# Patient Record
Sex: Female | Born: 1960 | Race: White | Hispanic: No | Marital: Married
Health system: Southern US, Community
[De-identification: ages and names within clinical notes are randomized; demographics above are authoritative.]

---

## 2005-03-29 ENCOUNTER — Ambulatory Visit (HOSPITAL_COMMUNITY): Admission: RE | Admit: 2005-03-29 | Discharge: 2005-03-29 | Payer: Self-pay | Admitting: Family Medicine

## 2005-12-01 ENCOUNTER — Other Ambulatory Visit: Admission: RE | Admit: 2005-12-01 | Discharge: 2005-12-01 | Payer: Self-pay | Admitting: Family Medicine

## 2006-03-30 ENCOUNTER — Ambulatory Visit (HOSPITAL_COMMUNITY): Admission: RE | Admit: 2006-03-30 | Discharge: 2006-03-30 | Payer: Self-pay | Admitting: Family Medicine

## 2007-01-26 ENCOUNTER — Other Ambulatory Visit: Admission: RE | Admit: 2007-01-26 | Discharge: 2007-01-26 | Payer: Self-pay | Admitting: Family Medicine

## 2007-04-05 ENCOUNTER — Ambulatory Visit (HOSPITAL_COMMUNITY): Admission: RE | Admit: 2007-04-05 | Discharge: 2007-04-05 | Payer: Self-pay | Admitting: Family Medicine

## 2008-03-26 ENCOUNTER — Other Ambulatory Visit: Admission: RE | Admit: 2008-03-26 | Discharge: 2008-03-26 | Payer: Self-pay | Admitting: Family Medicine

## 2008-04-07 ENCOUNTER — Ambulatory Visit (HOSPITAL_COMMUNITY): Admission: RE | Admit: 2008-04-07 | Discharge: 2008-04-07 | Payer: Self-pay | Admitting: Family Medicine

## 2009-04-20 ENCOUNTER — Ambulatory Visit (HOSPITAL_COMMUNITY): Admission: RE | Admit: 2009-04-20 | Discharge: 2009-04-20 | Payer: Self-pay | Admitting: Family Medicine

## 2009-05-08 ENCOUNTER — Other Ambulatory Visit: Admission: RE | Admit: 2009-05-08 | Discharge: 2009-05-08 | Payer: Self-pay | Admitting: Family Medicine

## 2010-02-05 ENCOUNTER — Encounter: Admission: RE | Admit: 2010-02-05 | Discharge: 2010-02-05 | Payer: Self-pay | Admitting: Family Medicine

## 2010-02-16 ENCOUNTER — Encounter
Admission: RE | Admit: 2010-02-16 | Discharge: 2010-02-16 | Payer: Self-pay | Source: Home / Self Care | Attending: Family Medicine | Admitting: Family Medicine

## 2010-02-16 ENCOUNTER — Other Ambulatory Visit
Admission: RE | Admit: 2010-02-16 | Discharge: 2010-02-16 | Payer: Self-pay | Source: Home / Self Care | Admitting: Interventional Radiology

## 2010-03-26 ENCOUNTER — Other Ambulatory Visit (HOSPITAL_COMMUNITY): Payer: Self-pay | Admitting: Family Medicine

## 2010-03-26 DIAGNOSIS — Z0000 Encounter for general adult medical examination without abnormal findings: Secondary | ICD-10-CM

## 2010-03-26 DIAGNOSIS — Z1231 Encounter for screening mammogram for malignant neoplasm of breast: Secondary | ICD-10-CM

## 2010-04-06 LAB — COMPREHENSIVE METABOLIC PANEL
AST: 22 U/L (ref 0–37)
Albumin: 3.6 g/dL (ref 3.5–5.2)
BUN: 10 mg/dL (ref 6–23)
Calcium: 9.2 mg/dL (ref 8.4–10.5)
Chloride: 106 mEq/L (ref 96–112)
Creatinine, Ser: 0.92 mg/dL (ref 0.4–1.2)
Total Bilirubin: 0.6 mg/dL (ref 0.3–1.2)
Total Protein: 6.3 g/dL (ref 6.0–8.3)

## 2010-04-06 LAB — CBC
HCT: 42.2 % (ref 36.0–46.0)
Hemoglobin: 14 g/dL (ref 12.0–15.0)
MCH: 29.6 pg (ref 26.0–34.0)
MCV: 89.2 fL (ref 78.0–100.0)
RBC: 4.73 MIL/uL (ref 3.87–5.11)
WBC: 7.8 10*3/uL (ref 4.0–10.5)

## 2010-04-06 LAB — SURGICAL PCR SCREEN

## 2010-04-15 ENCOUNTER — Ambulatory Visit (HOSPITAL_COMMUNITY)
Admission: RE | Admit: 2010-04-15 | Discharge: 2010-04-15 | Disposition: A | Discharge status: Home / Self Care | Payer: BC Managed Care – PPO | Source: Ambulatory Visit | Attending: Surgery | Admitting: Surgery

## 2010-04-15 ENCOUNTER — Other Ambulatory Visit: Payer: Self-pay | Admitting: Surgery

## 2010-04-15 DIAGNOSIS — D34 Benign neoplasm of thyroid gland: Secondary | ICD-10-CM | POA: Insufficient documentation

## 2010-04-19 NOTE — Op Note (Signed)
NAMESALIMAH, Andersen NO.:  [id number]  MEDICAL RECORD NO.:  [id number]           PATIENT TYPE:  O  LOCATION:  DAYL                         FACILITY:  [facility]  PHYSICIAN:  Ardeth Sportsman, MD     DATE OF BIRTH:  06-Apr-1960  DATE OF PROCEDURE:  04/15/2010 DATE OF DISCHARGE:                              OPERATIVE REPORT   PRIMARY CARE PHYSICIAN:  Brandy Andersen, M.D.  SURGEON:  Ardeth Sportsman, M.D.  ASSISTANT:  Abigail Miyamoto, M.D.  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  PROCEDURE PERFORMED:  Left thyroid lobectomy and isthmusectomy.  ANESTHESIA: 1. General anesthesia. 2. Local anesthetic in a field block.  SPECIMENS:  Left thyroid lobe and isthmus.  A stitch marks the left superior pole.  DRAINS:  None.  ESTIMATED BLOOD LOSS:  Less than 30 mL.  COMPLICATIONS:  None.  MAJOR INDICATIONS:  [name] is a 50 year old female, found to have an abnormal lesion in her neck.  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  She was sent to me for surgical evaluation.  The anatomy and physiology of parathyroid and thyroid function was discussed.  Given the fact this is an abnormal lesion, an excisional biopsy was recommended.  Because I did not have a definite confirmation of cancer, I thought a lobectomy and isthmusectomy would be warranted. The possibility of needing a total thyroidectomy depending on the interim findings was discussed.  The risks of nerve injury, injury to contiguous structures, and other risks, benefits, and alternatives were discussed.  Questions answered and she agreed to proceed.  OPERATIVE FINDINGS:  She had some maybe mild  thyroiditis and thickening of her thyroid gland, but it was of normal size.  In her medial left inferior pole, she had an obvious lesion in her thyroid gland, 2 cm, smooth and hard, but no contiguous invasion.  I did not sense any significant lymphadenopathy.  DESCRIPTION OF PROCEDURE:  Informed consent was confirmed.  The patient received IV cefazolin prior to incision.  She had sequential compression devices active during the entire case.  She was positioned supine with both arms tucked.  She had a gentle shoulder roll that helped elevate and put her neck more in a sniffing position under careful positioning with Anesthesia.  Her head, neck, and chest were prepped and draped in a sterile fashion.  Surgical time-out confirmed the plan.  I made a curvilinear collar incision 2 fingerbreadths above the clavicles, as she had a rather long neck, just inferior to where the obvious mass could be seen and felt.  I started around 5 cm and extend the incision to 6 cm.  I went through the subcutaneous tissues and platysma muscle layers transversely.  I elevated subplatysmal plane superiorly and inferiorly and used a Mahorner retractor to help expose the area.  I went between the strap muscles vertically through the midline longitudinal incision, began  to bluntly free off the left strap muscles off the left anterior thyroid lobe.  I switched off cautery and started using focused blunt dissection as well as Harmonic scalpel.  I initially mobilized laterally and could easily find the middle thyroid vein.  I isolated and ligated that with clips.  The inferior lobe was a little more obvious and the superior pole seemed to be going a little more elevated, so I decided to mobilize the inferior pole first.  I carefully freed off feeding vessels to the inferior pole and ligated them with clips and ultrasonic dissection.  The inferior parathyroid gland was seen on the inferior pole.  Eventually, I  mobilized that off and sent the inferior thyroid vessels and freed that off.  I was able to mobilize up towards the anterior trachea in the inferior pole.  I turned attention to the superior pole.  I freed the strap muscles off the anterolateral superior pole and came more proximally until I tapered off to the more proximal distal point.  I did bluntly free off the superior pole off the tracheal musculature as well.  With that, I came down and I could isolate the feeding vessels and ligated them with clips, staying very close onto the pole itself.  I saw the left superior parathyroid gland and the posterior superior pole and mobilized that off as well, taking care to avoid any injury and not manipulating it.  After that, I eventually could mobilize her well and I could see the left laryngeal nerve and was staying posteriorly and could follow its course. I stayed anterior to it.  Ligament of Allyson Sabal was a little more pronounced, but I carefully skeletonized that and ligate that off the laryngeal-tracheal junction using ultrasonic dissection, seeing the nerve and staying away from it.  With that final mobilization of ligament of Berry, I had excellent mobilization.  I freed the isthmus off the trachea bluntly and with focused cautery and came over it actually towards the left medial thyroid gland, staying on the anterior trachea and freed that of.  We inspected and saw good hemostasis.  There was a little oozing off the thyroid gland itself.  I did clamp the thyroid gland at the junction between the isthmus and the medial left thyroid lobe with a Kelly clamp. I freed the gland off using ultrasonic dissection and placed a 2-0 silk suture ligature on the medial thyroid lobe for marking and additional hemostasis.  I marked the thyroid lobe and sent that off.  We did copious irrigation in the wound.  Hemostasis was excellent.  I placed some fibrillar hemostatic agent in the left  tracheoesophageal groove wound and it laid well.  I reapproximated the strap muscles using 3-0 Vicryl interrupted stitches vertically and the platysmal muscles using 3-0 Vicryl interrupted stitches transversely.  The skin was closed using 4-0 Monocryl running subcuticular stitch with tails out and Steri- Strips.  The patient was extubated and sent to recovery room in stable condition.  I discussed postoperative care with the patient in my office and just prior in the holding area.  I am about to discuss it her family as well.     Ardeth Sportsman, MD     SCG/MEDQ  D:  04/15/2010  T:  04/16/2010  Job:  [id number]  cc:   Otilio Connors. Gerri Andersen, M.D. Fax: [phone]  Electronically Signed by Karie Soda MD on 04/19/2010 02:23:48 PM

## 2010-05-06 ENCOUNTER — Ambulatory Visit (HOSPITAL_COMMUNITY): Payer: Self-pay

## 2010-05-20 ENCOUNTER — Ambulatory Visit (HOSPITAL_COMMUNITY)
Admission: RE | Admit: 2010-05-20 | Discharge: 2010-05-20 | Disposition: A | Discharge status: Home / Self Care | Payer: BC Managed Care – PPO | Source: Ambulatory Visit | Attending: Family Medicine | Admitting: Family Medicine

## 2010-05-20 DIAGNOSIS — Z1231 Encounter for screening mammogram for malignant neoplasm of breast: Secondary | ICD-10-CM | POA: Insufficient documentation

## 2010-08-12 ENCOUNTER — Other Ambulatory Visit: Payer: Self-pay | Admitting: Gastroenterology

## 2010-09-06 ENCOUNTER — Ambulatory Visit (HOSPITAL_COMMUNITY)
Admission: AD | Admit: 2010-09-06 | Payer: BC Managed Care – PPO | Source: Ambulatory Visit | Admitting: Obstetrics and Gynecology

## 2010-11-19 ENCOUNTER — Telehealth (INDEPENDENT_AMBULATORY_CARE_PROVIDER_SITE_OTHER): Payer: Self-pay | Admitting: General Surgery

## 2010-11-19 NOTE — Telephone Encounter (Signed)
Patient left voicemail stating that she is trying to get a life insurance policy and they are wanting to raise her rates due to the spec of thyroid tissue left on the the right side of her thyroid. They are saying this could be precancerous. She may need Dr Michaell Cowing to dictate a letter to the life insurance company. Please call her.

## 2011-04-25 ENCOUNTER — Other Ambulatory Visit (HOSPITAL_COMMUNITY): Payer: Self-pay | Admitting: Family Medicine

## 2011-04-25 DIAGNOSIS — Z1231 Encounter for screening mammogram for malignant neoplasm of breast: Secondary | ICD-10-CM

## 2011-05-23 ENCOUNTER — Ambulatory Visit (HOSPITAL_COMMUNITY)
Admission: RE | Admit: 2011-05-23 | Discharge: 2011-05-23 | Disposition: A | Discharge status: Home / Self Care | Payer: BC Managed Care – PPO | Source: Ambulatory Visit | Attending: Family Medicine | Admitting: Family Medicine

## 2011-05-23 DIAGNOSIS — Z1231 Encounter for screening mammogram for malignant neoplasm of breast: Secondary | ICD-10-CM | POA: Insufficient documentation

## 2012-03-28 ENCOUNTER — Other Ambulatory Visit: Payer: Self-pay

## 2012-04-27 ENCOUNTER — Other Ambulatory Visit (HOSPITAL_COMMUNITY): Payer: Self-pay | Admitting: Family Medicine

## 2012-04-27 DIAGNOSIS — Z1231 Encounter for screening mammogram for malignant neoplasm of breast: Secondary | ICD-10-CM

## 2012-05-23 ENCOUNTER — Ambulatory Visit (HOSPITAL_COMMUNITY)
Admission: RE | Admit: 2012-05-23 | Discharge: 2012-05-23 | Disposition: A | Discharge status: Home / Self Care | Payer: BC Managed Care – PPO | Source: Ambulatory Visit | Attending: Family Medicine | Admitting: Family Medicine

## 2012-05-23 DIAGNOSIS — Z1231 Encounter for screening mammogram for malignant neoplasm of breast: Secondary | ICD-10-CM | POA: Insufficient documentation

## 2012-11-13 ENCOUNTER — Other Ambulatory Visit: Payer: Self-pay | Admitting: Family Medicine

## 2012-11-13 ENCOUNTER — Other Ambulatory Visit (HOSPITAL_COMMUNITY)
Admission: RE | Admit: 2012-11-13 | Discharge: 2012-11-13 | Disposition: A | Discharge status: Home / Self Care | Payer: BC Managed Care – PPO | Source: Ambulatory Visit | Attending: Family Medicine | Admitting: Family Medicine

## 2012-11-13 DIAGNOSIS — Z124 Encounter for screening for malignant neoplasm of cervix: Secondary | ICD-10-CM | POA: Insufficient documentation

## 2012-11-13 DIAGNOSIS — Z1151 Encounter for screening for human papillomavirus (HPV): Secondary | ICD-10-CM | POA: Insufficient documentation

## 2013-05-03 ENCOUNTER — Other Ambulatory Visit (HOSPITAL_COMMUNITY): Payer: Self-pay | Admitting: Family Medicine

## 2013-05-03 DIAGNOSIS — Z1231 Encounter for screening mammogram for malignant neoplasm of breast: Secondary | ICD-10-CM

## 2013-05-24 ENCOUNTER — Ambulatory Visit (HOSPITAL_COMMUNITY): Payer: BC Managed Care – PPO

## 2013-05-31 ENCOUNTER — Ambulatory Visit (HOSPITAL_COMMUNITY)
Admission: RE | Admit: 2013-05-31 | Discharge: 2013-05-31 | Disposition: A | Discharge status: Home / Self Care | Payer: BC Managed Care – PPO | Source: Ambulatory Visit | Attending: Family Medicine | Admitting: Family Medicine

## 2013-05-31 DIAGNOSIS — Z1231 Encounter for screening mammogram for malignant neoplasm of breast: Secondary | ICD-10-CM | POA: Insufficient documentation

## 2014-05-02 ENCOUNTER — Other Ambulatory Visit (HOSPITAL_COMMUNITY): Payer: Self-pay | Admitting: Family Medicine

## 2014-05-02 DIAGNOSIS — Z1231 Encounter for screening mammogram for malignant neoplasm of breast: Secondary | ICD-10-CM

## 2014-06-11 ENCOUNTER — Ambulatory Visit (HOSPITAL_COMMUNITY)
Admission: RE | Admit: 2014-06-11 | Discharge: 2014-06-11 | Disposition: A | Discharge status: Home / Self Care | Payer: BLUE CROSS/BLUE SHIELD | Source: Ambulatory Visit | Attending: Family Medicine | Admitting: Family Medicine

## 2014-06-11 DIAGNOSIS — Z1231 Encounter for screening mammogram for malignant neoplasm of breast: Secondary | ICD-10-CM | POA: Insufficient documentation

## 2015-05-07 ENCOUNTER — Other Ambulatory Visit: Payer: Self-pay

## 2015-05-07 DIAGNOSIS — Z1231 Encounter for screening mammogram for malignant neoplasm of breast: Secondary | ICD-10-CM

## 2015-06-15 ENCOUNTER — Ambulatory Visit
Admission: RE | Admit: 2015-06-15 | Discharge: 2015-06-15 | Disposition: A | Discharge status: Home / Self Care | Payer: BLUE CROSS/BLUE SHIELD | Source: Ambulatory Visit

## 2015-06-15 DIAGNOSIS — Z1231 Encounter for screening mammogram for malignant neoplasm of breast: Secondary | ICD-10-CM | POA: Diagnosis not present

## 2015-06-16 ENCOUNTER — Ambulatory Visit: Payer: BLUE CROSS/BLUE SHIELD

## 2015-09-16 DIAGNOSIS — M542 Cervicalgia: Secondary | ICD-10-CM | POA: Diagnosis not present

## 2015-09-16 DIAGNOSIS — M62830 Muscle spasm of back: Secondary | ICD-10-CM | POA: Diagnosis not present

## 2015-09-16 DIAGNOSIS — M9905 Segmental and somatic dysfunction of pelvic region: Secondary | ICD-10-CM | POA: Diagnosis not present

## 2015-09-16 DIAGNOSIS — M545 Low back pain: Secondary | ICD-10-CM | POA: Diagnosis not present

## 2015-09-17 DIAGNOSIS — Z872 Personal history of diseases of the skin and subcutaneous tissue: Secondary | ICD-10-CM | POA: Diagnosis not present

## 2015-09-17 DIAGNOSIS — M9905 Segmental and somatic dysfunction of pelvic region: Secondary | ICD-10-CM | POA: Diagnosis not present

## 2015-09-17 DIAGNOSIS — L821 Other seborrheic keratosis: Secondary | ICD-10-CM | POA: Diagnosis not present

## 2015-09-17 DIAGNOSIS — M62830 Muscle spasm of back: Secondary | ICD-10-CM | POA: Diagnosis not present

## 2015-09-17 DIAGNOSIS — L814 Other melanin hyperpigmentation: Secondary | ICD-10-CM | POA: Diagnosis not present

## 2015-09-17 DIAGNOSIS — M542 Cervicalgia: Secondary | ICD-10-CM | POA: Diagnosis not present

## 2015-09-17 DIAGNOSIS — M545 Low back pain: Secondary | ICD-10-CM | POA: Diagnosis not present

## 2015-09-17 DIAGNOSIS — D1801 Hemangioma of skin and subcutaneous tissue: Secondary | ICD-10-CM | POA: Diagnosis not present

## 2015-09-18 DIAGNOSIS — M542 Cervicalgia: Secondary | ICD-10-CM | POA: Diagnosis not present

## 2015-09-18 DIAGNOSIS — M9905 Segmental and somatic dysfunction of pelvic region: Secondary | ICD-10-CM | POA: Diagnosis not present

## 2015-09-18 DIAGNOSIS — M62830 Muscle spasm of back: Secondary | ICD-10-CM | POA: Diagnosis not present

## 2015-09-18 DIAGNOSIS — M545 Low back pain: Secondary | ICD-10-CM | POA: Diagnosis not present

## 2015-09-28 DIAGNOSIS — M542 Cervicalgia: Secondary | ICD-10-CM | POA: Diagnosis not present

## 2015-09-28 DIAGNOSIS — M9905 Segmental and somatic dysfunction of pelvic region: Secondary | ICD-10-CM | POA: Diagnosis not present

## 2015-09-28 DIAGNOSIS — M62830 Muscle spasm of back: Secondary | ICD-10-CM | POA: Diagnosis not present

## 2015-09-28 DIAGNOSIS — M545 Low back pain: Secondary | ICD-10-CM | POA: Diagnosis not present

## 2015-09-30 DIAGNOSIS — M9905 Segmental and somatic dysfunction of pelvic region: Secondary | ICD-10-CM | POA: Diagnosis not present

## 2015-09-30 DIAGNOSIS — M62830 Muscle spasm of back: Secondary | ICD-10-CM | POA: Diagnosis not present

## 2015-09-30 DIAGNOSIS — M545 Low back pain: Secondary | ICD-10-CM | POA: Diagnosis not present

## 2015-09-30 DIAGNOSIS — M542 Cervicalgia: Secondary | ICD-10-CM | POA: Diagnosis not present

## 2015-10-01 DIAGNOSIS — M542 Cervicalgia: Secondary | ICD-10-CM | POA: Diagnosis not present

## 2015-10-01 DIAGNOSIS — M9905 Segmental and somatic dysfunction of pelvic region: Secondary | ICD-10-CM | POA: Diagnosis not present

## 2015-10-01 DIAGNOSIS — M62830 Muscle spasm of back: Secondary | ICD-10-CM | POA: Diagnosis not present

## 2015-10-01 DIAGNOSIS — M545 Low back pain: Secondary | ICD-10-CM | POA: Diagnosis not present

## 2015-10-07 DIAGNOSIS — M545 Low back pain: Secondary | ICD-10-CM | POA: Diagnosis not present

## 2015-10-07 DIAGNOSIS — M9905 Segmental and somatic dysfunction of pelvic region: Secondary | ICD-10-CM | POA: Diagnosis not present

## 2015-10-07 DIAGNOSIS — M62830 Muscle spasm of back: Secondary | ICD-10-CM | POA: Diagnosis not present

## 2015-10-07 DIAGNOSIS — M542 Cervicalgia: Secondary | ICD-10-CM | POA: Diagnosis not present

## 2015-10-09 DIAGNOSIS — M62830 Muscle spasm of back: Secondary | ICD-10-CM | POA: Diagnosis not present

## 2015-10-09 DIAGNOSIS — M545 Low back pain: Secondary | ICD-10-CM | POA: Diagnosis not present

## 2015-10-09 DIAGNOSIS — M542 Cervicalgia: Secondary | ICD-10-CM | POA: Diagnosis not present

## 2015-10-09 DIAGNOSIS — M9905 Segmental and somatic dysfunction of pelvic region: Secondary | ICD-10-CM | POA: Diagnosis not present

## 2015-10-10 DIAGNOSIS — M545 Low back pain: Secondary | ICD-10-CM | POA: Diagnosis not present

## 2015-10-10 DIAGNOSIS — M9905 Segmental and somatic dysfunction of pelvic region: Secondary | ICD-10-CM | POA: Diagnosis not present

## 2015-10-10 DIAGNOSIS — M542 Cervicalgia: Secondary | ICD-10-CM | POA: Diagnosis not present

## 2015-10-10 DIAGNOSIS — M62830 Muscle spasm of back: Secondary | ICD-10-CM | POA: Diagnosis not present

## 2015-10-12 DIAGNOSIS — M9905 Segmental and somatic dysfunction of pelvic region: Secondary | ICD-10-CM | POA: Diagnosis not present

## 2015-10-12 DIAGNOSIS — M62830 Muscle spasm of back: Secondary | ICD-10-CM | POA: Diagnosis not present

## 2015-10-12 DIAGNOSIS — M542 Cervicalgia: Secondary | ICD-10-CM | POA: Diagnosis not present

## 2015-10-12 DIAGNOSIS — M545 Low back pain: Secondary | ICD-10-CM | POA: Diagnosis not present

## 2015-10-14 DIAGNOSIS — M9905 Segmental and somatic dysfunction of pelvic region: Secondary | ICD-10-CM | POA: Diagnosis not present

## 2015-10-14 DIAGNOSIS — M542 Cervicalgia: Secondary | ICD-10-CM | POA: Diagnosis not present

## 2015-10-14 DIAGNOSIS — M545 Low back pain: Secondary | ICD-10-CM | POA: Diagnosis not present

## 2015-10-14 DIAGNOSIS — M62830 Muscle spasm of back: Secondary | ICD-10-CM | POA: Diagnosis not present

## 2015-10-19 DIAGNOSIS — M9905 Segmental and somatic dysfunction of pelvic region: Secondary | ICD-10-CM | POA: Diagnosis not present

## 2015-10-19 DIAGNOSIS — M542 Cervicalgia: Secondary | ICD-10-CM | POA: Diagnosis not present

## 2015-10-19 DIAGNOSIS — M545 Low back pain: Secondary | ICD-10-CM | POA: Diagnosis not present

## 2015-10-19 DIAGNOSIS — M62830 Muscle spasm of back: Secondary | ICD-10-CM | POA: Diagnosis not present

## 2015-10-20 DIAGNOSIS — Z86010 Personal history of colonic polyps: Secondary | ICD-10-CM | POA: Diagnosis not present

## 2015-10-21 DIAGNOSIS — M9905 Segmental and somatic dysfunction of pelvic region: Secondary | ICD-10-CM | POA: Diagnosis not present

## 2015-10-21 DIAGNOSIS — M62830 Muscle spasm of back: Secondary | ICD-10-CM | POA: Diagnosis not present

## 2015-10-21 DIAGNOSIS — M542 Cervicalgia: Secondary | ICD-10-CM | POA: Diagnosis not present

## 2015-10-21 DIAGNOSIS — M545 Low back pain: Secondary | ICD-10-CM | POA: Diagnosis not present

## 2015-12-15 ENCOUNTER — Other Ambulatory Visit: Payer: Self-pay | Admitting: Family Medicine

## 2015-12-15 ENCOUNTER — Other Ambulatory Visit (HOSPITAL_COMMUNITY)
Admission: RE | Admit: 2015-12-15 | Discharge: 2015-12-15 | Disposition: A | Discharge status: Home / Self Care | Payer: BLUE CROSS/BLUE SHIELD | Source: Ambulatory Visit | Attending: Family Medicine | Admitting: Family Medicine

## 2015-12-15 DIAGNOSIS — Z23 Encounter for immunization: Secondary | ICD-10-CM | POA: Diagnosis not present

## 2015-12-15 DIAGNOSIS — Z1151 Encounter for screening for human papillomavirus (HPV): Secondary | ICD-10-CM | POA: Diagnosis not present

## 2015-12-15 DIAGNOSIS — E782 Mixed hyperlipidemia: Secondary | ICD-10-CM | POA: Diagnosis not present

## 2015-12-15 DIAGNOSIS — Z01419 Encounter for gynecological examination (general) (routine) without abnormal findings: Secondary | ICD-10-CM | POA: Diagnosis not present

## 2015-12-15 DIAGNOSIS — Z124 Encounter for screening for malignant neoplasm of cervix: Secondary | ICD-10-CM | POA: Diagnosis not present

## 2015-12-15 DIAGNOSIS — E039 Hypothyroidism, unspecified: Secondary | ICD-10-CM | POA: Diagnosis not present

## 2015-12-15 DIAGNOSIS — Z0000 Encounter for general adult medical examination without abnormal findings: Secondary | ICD-10-CM | POA: Diagnosis not present

## 2015-12-16 LAB — CYTOLOGY - PAP

## 2016-03-22 DIAGNOSIS — Z872 Personal history of diseases of the skin and subcutaneous tissue: Secondary | ICD-10-CM | POA: Diagnosis not present

## 2016-03-22 DIAGNOSIS — L814 Other melanin hyperpigmentation: Secondary | ICD-10-CM | POA: Diagnosis not present

## 2016-03-22 DIAGNOSIS — D1801 Hemangioma of skin and subcutaneous tissue: Secondary | ICD-10-CM | POA: Diagnosis not present

## 2016-03-22 DIAGNOSIS — L821 Other seborrheic keratosis: Secondary | ICD-10-CM | POA: Diagnosis not present

## 2016-06-02 ENCOUNTER — Other Ambulatory Visit: Payer: Self-pay | Admitting: Family Medicine

## 2016-06-02 DIAGNOSIS — Z1231 Encounter for screening mammogram for malignant neoplasm of breast: Secondary | ICD-10-CM

## 2016-06-22 ENCOUNTER — Ambulatory Visit
Admission: RE | Admit: 2016-06-22 | Discharge: 2016-06-22 | Disposition: A | Discharge status: Home / Self Care | Payer: BLUE CROSS/BLUE SHIELD | Source: Ambulatory Visit | Attending: Family Medicine | Admitting: Family Medicine

## 2016-06-22 DIAGNOSIS — Z1231 Encounter for screening mammogram for malignant neoplasm of breast: Secondary | ICD-10-CM

## 2016-09-14 DIAGNOSIS — N393 Stress incontinence (female) (male): Secondary | ICD-10-CM | POA: Diagnosis not present

## 2016-09-20 DIAGNOSIS — D2262 Melanocytic nevi of left upper limb, including shoulder: Secondary | ICD-10-CM | POA: Diagnosis not present

## 2016-09-20 DIAGNOSIS — N390 Urinary tract infection, site not specified: Secondary | ICD-10-CM | POA: Diagnosis not present

## 2016-09-20 DIAGNOSIS — L814 Other melanin hyperpigmentation: Secondary | ICD-10-CM | POA: Diagnosis not present

## 2016-09-20 DIAGNOSIS — L821 Other seborrheic keratosis: Secondary | ICD-10-CM | POA: Diagnosis not present

## 2016-09-20 DIAGNOSIS — D485 Neoplasm of uncertain behavior of skin: Secondary | ICD-10-CM | POA: Diagnosis not present

## 2016-09-20 DIAGNOSIS — L570 Actinic keratosis: Secondary | ICD-10-CM | POA: Diagnosis not present

## 2016-09-20 DIAGNOSIS — N393 Stress incontinence (female) (male): Secondary | ICD-10-CM | POA: Diagnosis not present

## 2016-09-20 DIAGNOSIS — D1801 Hemangioma of skin and subcutaneous tissue: Secondary | ICD-10-CM | POA: Diagnosis not present

## 2016-10-04 DIAGNOSIS — N393 Stress incontinence (female) (male): Secondary | ICD-10-CM | POA: Diagnosis not present

## 2016-10-26 DIAGNOSIS — N393 Stress incontinence (female) (male): Secondary | ICD-10-CM | POA: Diagnosis not present

## 2016-10-27 DIAGNOSIS — R339 Retention of urine, unspecified: Secondary | ICD-10-CM | POA: Diagnosis not present

## 2016-12-27 DIAGNOSIS — E782 Mixed hyperlipidemia: Secondary | ICD-10-CM | POA: Diagnosis not present

## 2016-12-27 DIAGNOSIS — Z23 Encounter for immunization: Secondary | ICD-10-CM | POA: Diagnosis not present

## 2016-12-27 DIAGNOSIS — Z0000 Encounter for general adult medical examination without abnormal findings: Secondary | ICD-10-CM | POA: Diagnosis not present

## 2016-12-27 DIAGNOSIS — R5383 Other fatigue: Secondary | ICD-10-CM | POA: Diagnosis not present

## 2016-12-27 DIAGNOSIS — E039 Hypothyroidism, unspecified: Secondary | ICD-10-CM | POA: Diagnosis not present

## 2017-03-20 DIAGNOSIS — N762 Acute vulvitis: Secondary | ICD-10-CM | POA: Diagnosis not present

## 2017-03-20 DIAGNOSIS — N904 Leukoplakia of vulva: Secondary | ICD-10-CM | POA: Diagnosis not present

## 2017-06-05 ENCOUNTER — Other Ambulatory Visit: Payer: Self-pay | Admitting: Family Medicine

## 2017-06-05 DIAGNOSIS — Z1231 Encounter for screening mammogram for malignant neoplasm of breast: Secondary | ICD-10-CM

## 2017-07-05 ENCOUNTER — Ambulatory Visit
Admission: RE | Admit: 2017-07-05 | Discharge: 2017-07-05 | Disposition: A | Discharge status: Home / Self Care | Payer: BLUE CROSS/BLUE SHIELD | Source: Ambulatory Visit | Attending: Family Medicine | Admitting: Family Medicine

## 2017-07-05 DIAGNOSIS — Z1231 Encounter for screening mammogram for malignant neoplasm of breast: Secondary | ICD-10-CM

## 2017-09-25 DIAGNOSIS — D1801 Hemangioma of skin and subcutaneous tissue: Secondary | ICD-10-CM | POA: Diagnosis not present

## 2017-09-25 DIAGNOSIS — D225 Melanocytic nevi of trunk: Secondary | ICD-10-CM | POA: Diagnosis not present

## 2017-09-25 DIAGNOSIS — L814 Other melanin hyperpigmentation: Secondary | ICD-10-CM | POA: Diagnosis not present

## 2017-09-25 DIAGNOSIS — L821 Other seborrheic keratosis: Secondary | ICD-10-CM | POA: Diagnosis not present

## 2018-01-08 DIAGNOSIS — Z5181 Encounter for therapeutic drug level monitoring: Secondary | ICD-10-CM | POA: Diagnosis not present

## 2018-01-08 DIAGNOSIS — E039 Hypothyroidism, unspecified: Secondary | ICD-10-CM | POA: Diagnosis not present

## 2018-01-08 DIAGNOSIS — E782 Mixed hyperlipidemia: Secondary | ICD-10-CM | POA: Diagnosis not present

## 2018-01-08 DIAGNOSIS — Z0000 Encounter for general adult medical examination without abnormal findings: Secondary | ICD-10-CM | POA: Diagnosis not present

## 2018-04-18 DIAGNOSIS — E782 Mixed hyperlipidemia: Secondary | ICD-10-CM | POA: Diagnosis not present

## 2018-07-23 DIAGNOSIS — N952 Postmenopausal atrophic vaginitis: Secondary | ICD-10-CM | POA: Diagnosis not present

## 2018-08-13 ENCOUNTER — Other Ambulatory Visit: Payer: Self-pay | Admitting: Family Medicine

## 2018-08-13 DIAGNOSIS — Z1231 Encounter for screening mammogram for malignant neoplasm of breast: Secondary | ICD-10-CM

## 2018-09-13 ENCOUNTER — Other Ambulatory Visit: Payer: Self-pay | Admitting: Family Medicine

## 2018-09-13 DIAGNOSIS — Z1231 Encounter for screening mammogram for malignant neoplasm of breast: Secondary | ICD-10-CM

## 2018-09-20 ENCOUNTER — Other Ambulatory Visit: Payer: Self-pay

## 2018-09-20 ENCOUNTER — Ambulatory Visit (INDEPENDENT_AMBULATORY_CARE_PROVIDER_SITE_OTHER): Payer: BC Managed Care – PPO

## 2018-09-20 DIAGNOSIS — Z1231 Encounter for screening mammogram for malignant neoplasm of breast: Secondary | ICD-10-CM | POA: Diagnosis not present

## 2018-09-28 ENCOUNTER — Ambulatory Visit: Payer: BLUE CROSS/BLUE SHIELD

## 2018-10-03 DIAGNOSIS — D1801 Hemangioma of skin and subcutaneous tissue: Secondary | ICD-10-CM | POA: Diagnosis not present

## 2018-10-03 DIAGNOSIS — D225 Melanocytic nevi of trunk: Secondary | ICD-10-CM | POA: Diagnosis not present

## 2018-10-03 DIAGNOSIS — L820 Inflamed seborrheic keratosis: Secondary | ICD-10-CM | POA: Diagnosis not present

## 2018-10-03 DIAGNOSIS — L821 Other seborrheic keratosis: Secondary | ICD-10-CM | POA: Diagnosis not present

## 2018-10-12 ENCOUNTER — Ambulatory Visit: Payer: BLUE CROSS/BLUE SHIELD

## 2019-08-08 ENCOUNTER — Other Ambulatory Visit: Payer: Self-pay | Admitting: Family Medicine

## 2019-08-08 DIAGNOSIS — Z1231 Encounter for screening mammogram for malignant neoplasm of breast: Secondary | ICD-10-CM

## 2019-09-23 ENCOUNTER — Ambulatory Visit
Admission: RE | Admit: 2019-09-23 | Discharge: 2019-09-23 | Disposition: A | Discharge status: Home / Self Care | Payer: BC Managed Care – PPO | Source: Ambulatory Visit

## 2019-09-23 ENCOUNTER — Other Ambulatory Visit: Payer: Self-pay

## 2019-09-23 DIAGNOSIS — Z1231 Encounter for screening mammogram for malignant neoplasm of breast: Secondary | ICD-10-CM

## 2020-08-28 ENCOUNTER — Other Ambulatory Visit: Payer: Self-pay | Admitting: Family Medicine

## 2020-08-28 DIAGNOSIS — Z1231 Encounter for screening mammogram for malignant neoplasm of breast: Secondary | ICD-10-CM

## 2020-10-21 ENCOUNTER — Other Ambulatory Visit: Payer: Self-pay

## 2020-10-21 ENCOUNTER — Ambulatory Visit
Admission: RE | Admit: 2020-10-21 | Discharge: 2020-10-21 | Disposition: A | Discharge status: Home / Self Care | Payer: 59 | Source: Ambulatory Visit

## 2020-10-21 DIAGNOSIS — Z1231 Encounter for screening mammogram for malignant neoplasm of breast: Secondary | ICD-10-CM

## 2020-11-19 ENCOUNTER — Other Ambulatory Visit (HOSPITAL_COMMUNITY)
Admission: RE | Admit: 2020-11-19 | Discharge: 2020-11-19 | Disposition: A | Discharge status: Home / Self Care | Payer: 59 | Source: Ambulatory Visit | Attending: Family Medicine | Admitting: Family Medicine

## 2020-11-19 DIAGNOSIS — Z01411 Encounter for gynecological examination (general) (routine) with abnormal findings: Secondary | ICD-10-CM | POA: Diagnosis present

## 2020-11-24 LAB — CYTOLOGY - PAP
Comment: NEGATIVE
Diagnosis: NEGATIVE
High risk HPV: NEGATIVE

## 2020-12-03 ENCOUNTER — Ambulatory Visit (INDEPENDENT_AMBULATORY_CARE_PROVIDER_SITE_OTHER): Payer: 59 | Admitting: Otolaryngology

## 2020-12-03 ENCOUNTER — Other Ambulatory Visit: Payer: Self-pay

## 2020-12-03 DIAGNOSIS — J310 Chronic rhinitis: Secondary | ICD-10-CM | POA: Diagnosis not present

## 2020-12-03 NOTE — Progress Notes (Signed)
HPI: Brandy Andersen is a 60 y.o. female who presents is referred by her PCP for evaluation of recent ear infection that began July 31.  She had discomfort and decreased hearing predominantly in the right ear more so than the left.  She also had a sinus infection at that time was treated with antibiotics as well as decongestants which she did not tolerate well.  She has also been on nasal steroid sprays Flonase and Nasacort and presently using the Flonase.  She had muffled hearing in the right ear but this is doing better today and the left ear has always done well..  No past medical history on file. No past surgical history on file. Social History   Socioeconomic History   Marital status: Married    Spouse name: Not on file   Number of children: Not on file   Years of education: Not on file   Highest education level: Not on file  Occupational History   Not on file  Tobacco Use   Smoking status: Not on file   Smokeless tobacco: Not on file  Substance and Sexual Activity   Alcohol use: Not on file   Drug use: Not on file   Sexual activity: Not on file  Other Topics Concern   Not on file  Social History Narrative   Not on file   Social Determinants of Health   Financial Resource Strain: Not on file  Food Insecurity: Not on file  Transportation Needs: Not on file  Physical Activity: Not on file  Stress: Not on file  Social Connections: Not on file   No family history on file. Not on File Prior to Admission medications   Not on File     Positive ROS: Otherwise negative  All other systems have been reviewed and were otherwise negative with the exception of those mentioned in the HPI and as above.  Physical Exam: Constitutional: Alert, well-appearing, no acute distress Ears: External ears without lesions or tenderness. Ear canals are clear bilaterally.  Both TMs are clear with good mobility on pneumatic otoscopy.  No middle ear fluid or effusion noted.  On hearing  screening with the tuning forks Weber was midline and AC > BC bilaterally.  Subjectively she had perhaps a mild decreased hearing in the upper frequencies with a 1024 tuning fork but this was minimal.  And she heard about the same in both ears. Nasal: External nose without lesions. Septum with minimal septal deviation and mild rhinitis.  Both middle meatus regions were clear with no signs of infection.  No polyps noted..  Oral: Lips and gums without lesions. Tongue and palate mucosa without lesions. Posterior oropharynx clear. Neck: No palpable adenopathy or masses.  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  Procedures  Assessment: Resolved otitis media. Chronic rhinitis.  Plan: Reviewed with her concerning clear ear exam today with symmetric hearing. Suggested use of the nasal steroid spray at night on a regular basis if she has any nasal congestion or sinus problems or congestion of the ears. She will follow-up as needed   Radene Journey, MD   CC:

## 2021-04-11 DIAGNOSIS — H10021 Other mucopurulent conjunctivitis, right eye: Secondary | ICD-10-CM | POA: Diagnosis not present

## 2021-10-21 ENCOUNTER — Other Ambulatory Visit (HOSPITAL_BASED_OUTPATIENT_CLINIC_OR_DEPARTMENT_OTHER): Payer: Self-pay | Admitting: Family Medicine

## 2021-10-21 DIAGNOSIS — Z1231 Encounter for screening mammogram for malignant neoplasm of breast: Secondary | ICD-10-CM

## 2021-10-22 ENCOUNTER — Ambulatory Visit (HOSPITAL_BASED_OUTPATIENT_CLINIC_OR_DEPARTMENT_OTHER)
Admission: RE | Admit: 2021-10-22 | Discharge: 2021-10-22 | Disposition: A | Discharge status: Home / Self Care | Payer: 59 | Source: Ambulatory Visit | Attending: Family Medicine | Admitting: Family Medicine

## 2021-10-22 DIAGNOSIS — Z1231 Encounter for screening mammogram for malignant neoplasm of breast: Secondary | ICD-10-CM | POA: Insufficient documentation

## 2021-11-24 DIAGNOSIS — L814 Other melanin hyperpigmentation: Secondary | ICD-10-CM | POA: Diagnosis not present

## 2021-11-24 DIAGNOSIS — L821 Other seborrheic keratosis: Secondary | ICD-10-CM | POA: Diagnosis not present

## 2021-11-24 DIAGNOSIS — D225 Melanocytic nevi of trunk: Secondary | ICD-10-CM | POA: Diagnosis not present

## 2021-12-03 DIAGNOSIS — E782 Mixed hyperlipidemia: Secondary | ICD-10-CM | POA: Diagnosis not present

## 2021-12-03 DIAGNOSIS — Z5181 Encounter for therapeutic drug level monitoring: Secondary | ICD-10-CM | POA: Diagnosis not present

## 2021-12-03 DIAGNOSIS — Z23 Encounter for immunization: Secondary | ICD-10-CM | POA: Diagnosis not present

## 2021-12-03 DIAGNOSIS — Z0000 Encounter for general adult medical examination without abnormal findings: Secondary | ICD-10-CM | POA: Diagnosis not present

## 2021-12-03 DIAGNOSIS — E039 Hypothyroidism, unspecified: Secondary | ICD-10-CM | POA: Diagnosis not present

## 2021-12-03 DIAGNOSIS — N951 Menopausal and female climacteric states: Secondary | ICD-10-CM | POA: Diagnosis not present

## 2022-04-01 DIAGNOSIS — U071 COVID-19: Secondary | ICD-10-CM | POA: Diagnosis not present

## 2022-04-01 DIAGNOSIS — R051 Acute cough: Secondary | ICD-10-CM | POA: Diagnosis not present

## 2022-04-01 DIAGNOSIS — Z6823 Body mass index (BMI) 23.0-23.9, adult: Secondary | ICD-10-CM | POA: Diagnosis not present

## 2022-08-15 DIAGNOSIS — E039 Hypothyroidism, unspecified: Secondary | ICD-10-CM | POA: Diagnosis not present

## 2022-08-15 DIAGNOSIS — E785 Hyperlipidemia, unspecified: Secondary | ICD-10-CM | POA: Diagnosis not present

## 2022-08-15 DIAGNOSIS — R030 Elevated blood-pressure reading, without diagnosis of hypertension: Secondary | ICD-10-CM | POA: Diagnosis not present

## 2022-08-15 DIAGNOSIS — Z8249 Family history of ischemic heart disease and other diseases of the circulatory system: Secondary | ICD-10-CM | POA: Diagnosis not present

## 2022-09-26 ENCOUNTER — Other Ambulatory Visit (HOSPITAL_BASED_OUTPATIENT_CLINIC_OR_DEPARTMENT_OTHER): Payer: Self-pay

## 2022-09-26 DIAGNOSIS — Z1231 Encounter for screening mammogram for malignant neoplasm of breast: Secondary | ICD-10-CM

## 2022-10-17 DIAGNOSIS — L814 Other melanin hyperpigmentation: Secondary | ICD-10-CM | POA: Diagnosis not present

## 2022-10-17 DIAGNOSIS — L821 Other seborrheic keratosis: Secondary | ICD-10-CM | POA: Diagnosis not present

## 2022-10-17 DIAGNOSIS — D225 Melanocytic nevi of trunk: Secondary | ICD-10-CM | POA: Diagnosis not present

## 2022-10-17 DIAGNOSIS — Z7189 Other specified counseling: Secondary | ICD-10-CM | POA: Diagnosis not present

## 2022-10-25 ENCOUNTER — Ambulatory Visit (HOSPITAL_BASED_OUTPATIENT_CLINIC_OR_DEPARTMENT_OTHER): Payer: 59 | Admitting: Radiology

## 2022-10-31 ENCOUNTER — Other Ambulatory Visit: Payer: Self-pay | Admitting: Family Medicine

## 2022-10-31 DIAGNOSIS — Z1231 Encounter for screening mammogram for malignant neoplasm of breast: Secondary | ICD-10-CM

## 2022-11-03 ENCOUNTER — Ambulatory Visit (HOSPITAL_BASED_OUTPATIENT_CLINIC_OR_DEPARTMENT_OTHER)
Admission: RE | Admit: 2022-11-03 | Discharge: 2022-11-03 | Disposition: A | Discharge status: Home / Self Care | Payer: 59 | Source: Ambulatory Visit | Attending: Family Medicine | Admitting: Family Medicine

## 2022-11-03 DIAGNOSIS — Z1231 Encounter for screening mammogram for malignant neoplasm of breast: Secondary | ICD-10-CM | POA: Insufficient documentation

## 2022-12-13 DIAGNOSIS — E559 Vitamin D deficiency, unspecified: Secondary | ICD-10-CM | POA: Diagnosis not present

## 2022-12-13 DIAGNOSIS — E039 Hypothyroidism, unspecified: Secondary | ICD-10-CM | POA: Diagnosis not present

## 2022-12-13 DIAGNOSIS — E782 Mixed hyperlipidemia: Secondary | ICD-10-CM | POA: Diagnosis not present

## 2022-12-13 DIAGNOSIS — Z0000 Encounter for general adult medical examination without abnormal findings: Secondary | ICD-10-CM | POA: Diagnosis not present

## 2023-06-12 DIAGNOSIS — Z6824 Body mass index (BMI) 24.0-24.9, adult: Secondary | ICD-10-CM | POA: Diagnosis not present

## 2023-06-12 DIAGNOSIS — R635 Abnormal weight gain: Secondary | ICD-10-CM | POA: Diagnosis not present

## 2023-06-12 DIAGNOSIS — E039 Hypothyroidism, unspecified: Secondary | ICD-10-CM | POA: Diagnosis not present

## 2023-06-12 DIAGNOSIS — K5909 Other constipation: Secondary | ICD-10-CM | POA: Diagnosis not present

## 2023-06-12 DIAGNOSIS — E782 Mixed hyperlipidemia: Secondary | ICD-10-CM | POA: Diagnosis not present

## 2023-06-26 DIAGNOSIS — R635 Abnormal weight gain: Secondary | ICD-10-CM | POA: Diagnosis not present

## 2023-06-26 DIAGNOSIS — E039 Hypothyroidism, unspecified: Secondary | ICD-10-CM | POA: Diagnosis not present

## 2023-06-26 DIAGNOSIS — E782 Mixed hyperlipidemia: Secondary | ICD-10-CM | POA: Diagnosis not present

## 2023-06-26 DIAGNOSIS — K5909 Other constipation: Secondary | ICD-10-CM | POA: Diagnosis not present

## 2023-06-26 DIAGNOSIS — Z6824 Body mass index (BMI) 24.0-24.9, adult: Secondary | ICD-10-CM | POA: Diagnosis not present

## 2023-07-03 DIAGNOSIS — E039 Hypothyroidism, unspecified: Secondary | ICD-10-CM | POA: Diagnosis not present

## 2023-07-15 IMAGING — MG MM DIGITAL SCREENING BILAT W/ TOMO AND CAD
8 series · 8 of 24 positions shown · non-contrast
Comparison: Previous exam(s).

CLINICAL DATA: Screening.

EXAM:
DIGITAL SCREENING BILATERAL MAMMOGRAM WITH TOMOSYNTHESIS AND CAD
TECHNIQUE: Bilateral screening digital craniocaudal and mediolateral oblique
mammograms were obtained. Bilateral screening digital breast
tomosynthesis was performed. The images were evaluated with
computer-aided detection.

[R MLO synth-2D]
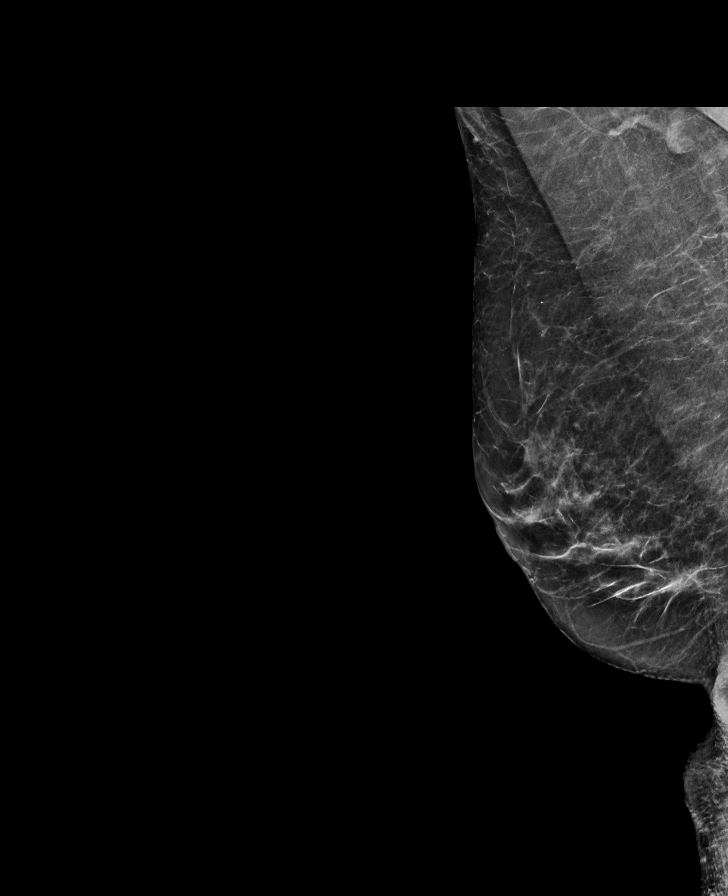

[R CC synth-2D]
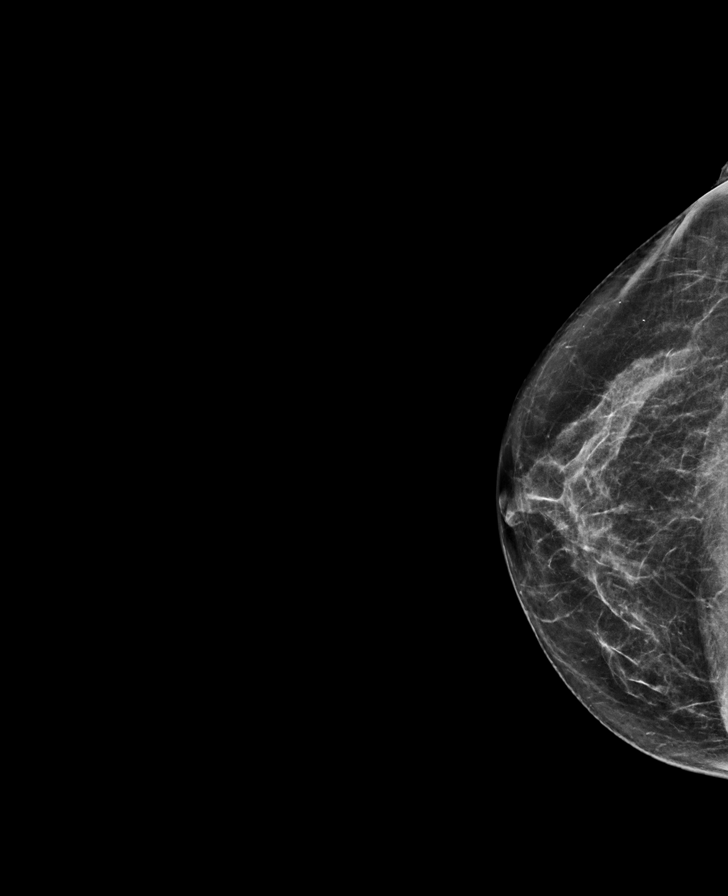

[L MLO synth-2D]
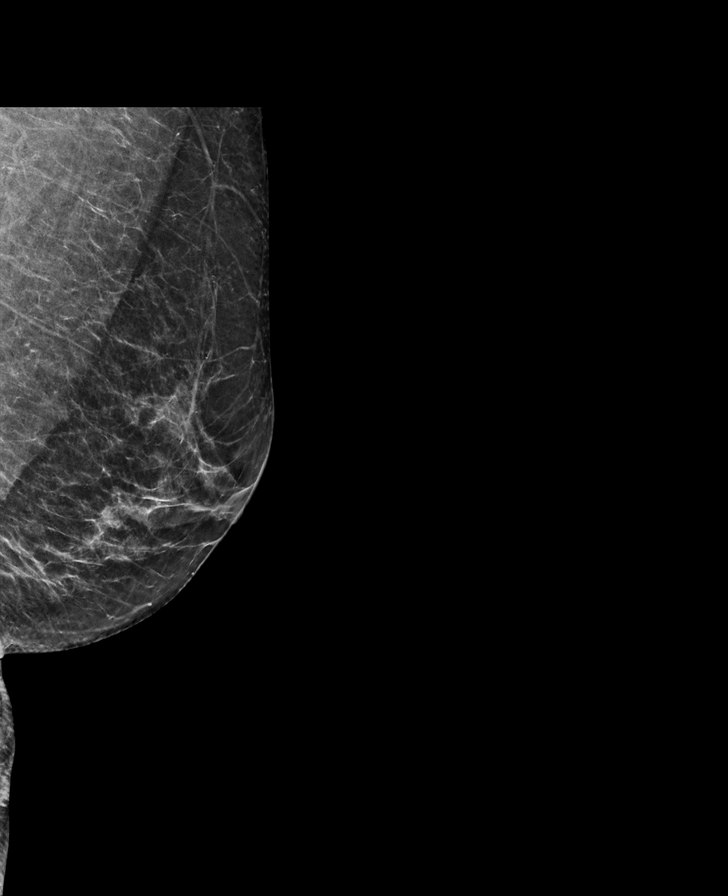

[L CC synth-2D]
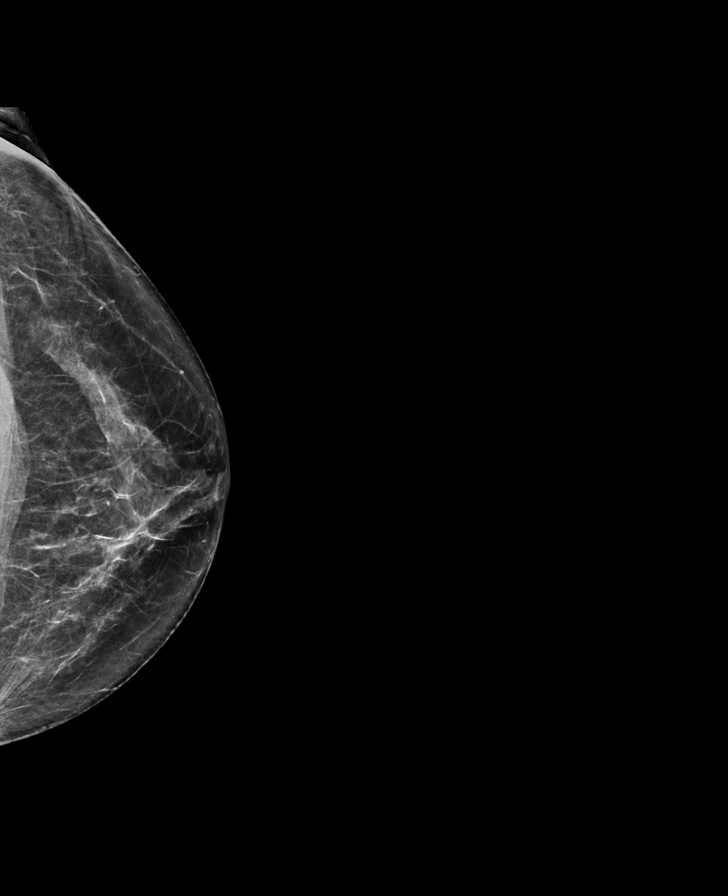

[L MLO tomo · tomo slice 30/59.0]
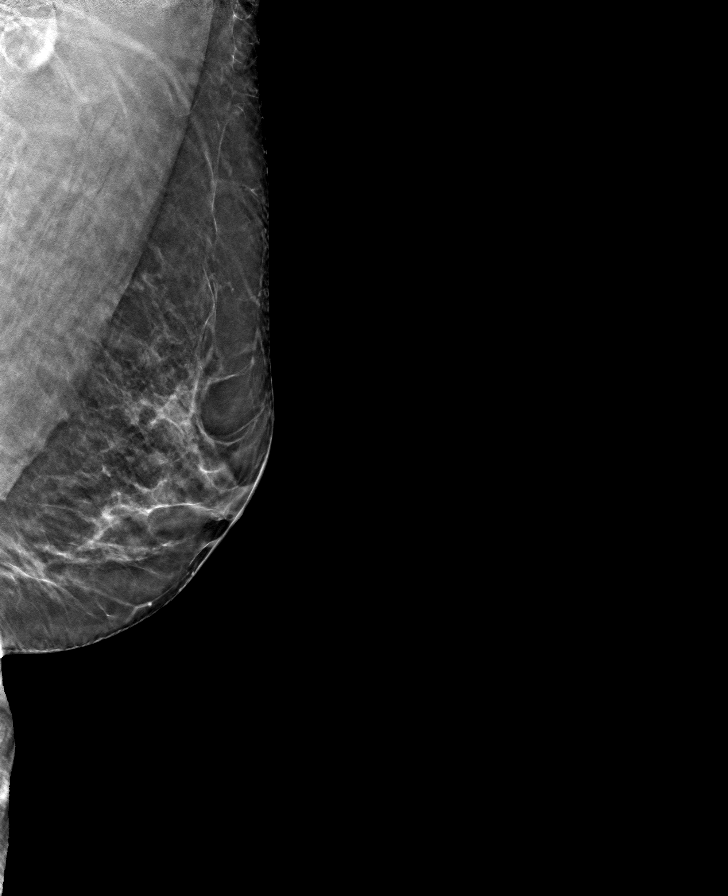

[R CC tomo · tomo slice 30/59.0]
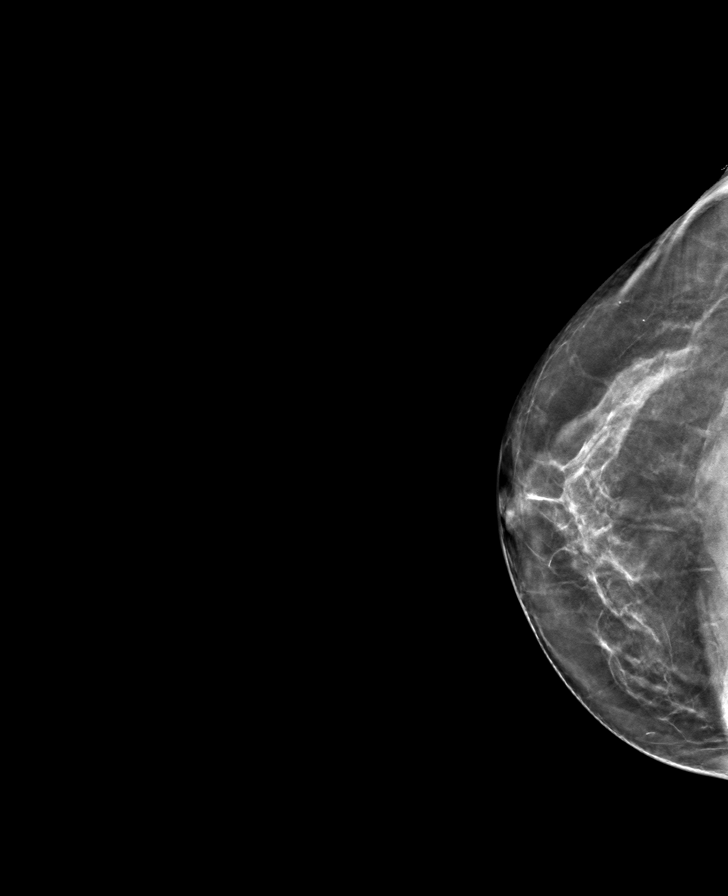

[R MLO tomo · tomo slice 29/58.0]
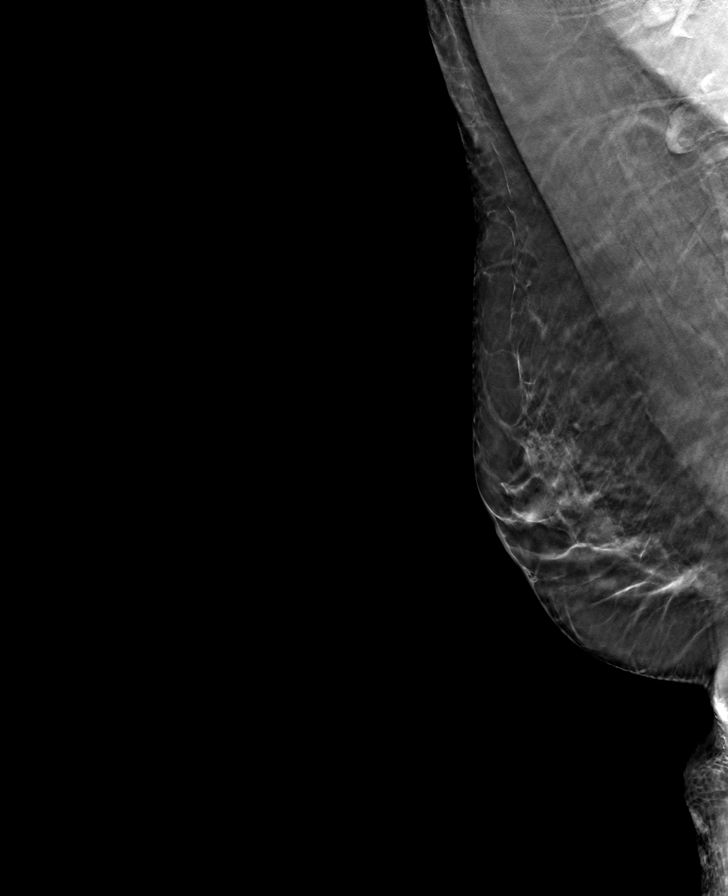

[L CC tomo · tomo slice 33/64.0]
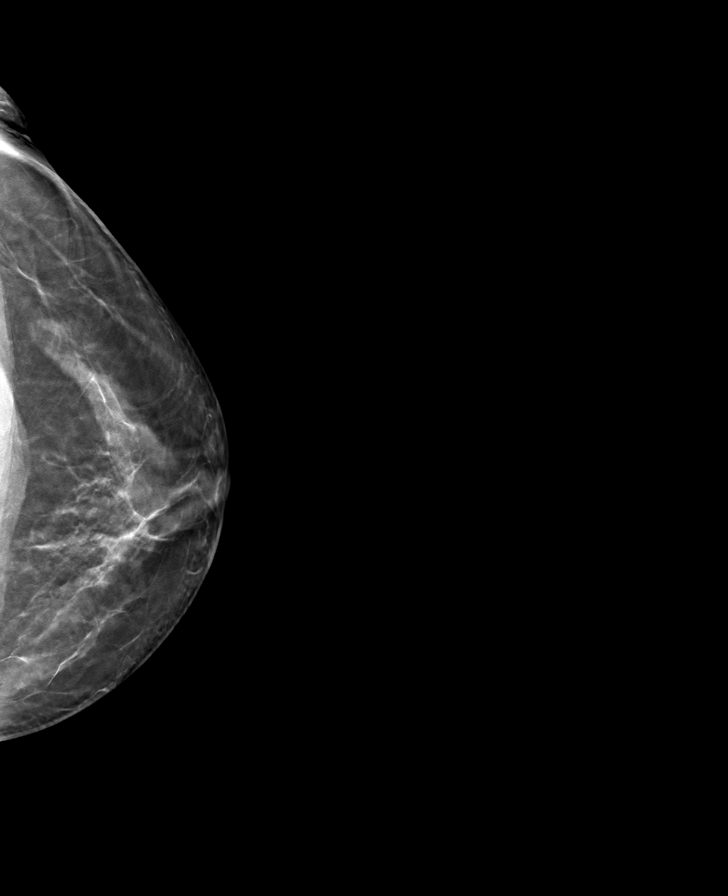

[8 of 24 positions shown; findings below may reference images not displayed]

ACR Breast Density Category b: There are scattered areas of
fibroglandular density.
FINDINGS: There are no findings suspicious for malignancy.
IMPRESSION: No mammographic evidence of malignancy. A result letter of this
screening mammogram will be mailed directly to the patient.

RECOMMENDATION:
Screening mammogram in one year. (Code:51-O-LD2)

BI-RADS CATEGORY  1: Negative.

## 2023-07-25 DIAGNOSIS — E039 Hypothyroidism, unspecified: Secondary | ICD-10-CM | POA: Diagnosis not present

## 2023-07-25 DIAGNOSIS — R635 Abnormal weight gain: Secondary | ICD-10-CM | POA: Diagnosis not present

## 2023-07-25 DIAGNOSIS — K5909 Other constipation: Secondary | ICD-10-CM | POA: Diagnosis not present

## 2023-07-25 DIAGNOSIS — Z6824 Body mass index (BMI) 24.0-24.9, adult: Secondary | ICD-10-CM | POA: Diagnosis not present

## 2023-07-25 DIAGNOSIS — E782 Mixed hyperlipidemia: Secondary | ICD-10-CM | POA: Diagnosis not present

## 2023-09-13 DIAGNOSIS — E782 Mixed hyperlipidemia: Secondary | ICD-10-CM | POA: Diagnosis not present

## 2023-09-13 DIAGNOSIS — R635 Abnormal weight gain: Secondary | ICD-10-CM | POA: Diagnosis not present

## 2023-09-13 DIAGNOSIS — K5909 Other constipation: Secondary | ICD-10-CM | POA: Diagnosis not present

## 2023-09-13 DIAGNOSIS — E039 Hypothyroidism, unspecified: Secondary | ICD-10-CM | POA: Diagnosis not present

## 2023-09-13 DIAGNOSIS — Z6823 Body mass index (BMI) 23.0-23.9, adult: Secondary | ICD-10-CM | POA: Diagnosis not present

## 2023-10-03 ENCOUNTER — Other Ambulatory Visit (HOSPITAL_BASED_OUTPATIENT_CLINIC_OR_DEPARTMENT_OTHER): Payer: Self-pay | Admitting: Family Medicine

## 2023-10-03 DIAGNOSIS — Z1231 Encounter for screening mammogram for malignant neoplasm of breast: Secondary | ICD-10-CM

## 2023-11-04 ENCOUNTER — Other Ambulatory Visit: Payer: Self-pay | Admitting: Medical Genetics

## 2023-11-07 ENCOUNTER — Ambulatory Visit (HOSPITAL_BASED_OUTPATIENT_CLINIC_OR_DEPARTMENT_OTHER): Admitting: Radiology

## 2023-11-11 ENCOUNTER — Ambulatory Visit (HOSPITAL_BASED_OUTPATIENT_CLINIC_OR_DEPARTMENT_OTHER)
Admission: RE | Admit: 2023-11-11 | Discharge: 2023-11-11 | Disposition: A | Discharge status: Home / Self Care | Source: Ambulatory Visit | Attending: Family Medicine | Admitting: Family Medicine

## 2023-11-11 ENCOUNTER — Encounter (HOSPITAL_BASED_OUTPATIENT_CLINIC_OR_DEPARTMENT_OTHER): Payer: Self-pay | Admitting: Radiology

## 2023-11-11 DIAGNOSIS — Z1231 Encounter for screening mammogram for malignant neoplasm of breast: Secondary | ICD-10-CM | POA: Diagnosis not present

## 2023-11-16 ENCOUNTER — Other Ambulatory Visit: Payer: Self-pay

## 2023-11-16 DIAGNOSIS — Z006 Encounter for examination for normal comparison and control in clinical research program: Secondary | ICD-10-CM

## 2023-11-24 LAB — GENECONNECT MOLECULAR SCREEN: Genetic Analysis Overall Interpretation: NEGATIVE

## 2023-12-12 DIAGNOSIS — L814 Other melanin hyperpigmentation: Secondary | ICD-10-CM | POA: Diagnosis not present

## 2023-12-12 DIAGNOSIS — Z872 Personal history of diseases of the skin and subcutaneous tissue: Secondary | ICD-10-CM | POA: Diagnosis not present

## 2023-12-12 DIAGNOSIS — D1801 Hemangioma of skin and subcutaneous tissue: Secondary | ICD-10-CM | POA: Diagnosis not present

## 2023-12-12 DIAGNOSIS — L821 Other seborrheic keratosis: Secondary | ICD-10-CM | POA: Diagnosis not present

## 2023-12-14 DIAGNOSIS — R635 Abnormal weight gain: Secondary | ICD-10-CM | POA: Diagnosis not present

## 2023-12-14 DIAGNOSIS — E039 Hypothyroidism, unspecified: Secondary | ICD-10-CM | POA: Diagnosis not present

## 2023-12-14 DIAGNOSIS — Z6823 Body mass index (BMI) 23.0-23.9, adult: Secondary | ICD-10-CM | POA: Diagnosis not present

## 2023-12-14 DIAGNOSIS — K5909 Other constipation: Secondary | ICD-10-CM | POA: Diagnosis not present

## 2023-12-14 DIAGNOSIS — E782 Mixed hyperlipidemia: Secondary | ICD-10-CM | POA: Diagnosis not present

## 2023-12-19 DIAGNOSIS — E782 Mixed hyperlipidemia: Secondary | ICD-10-CM | POA: Diagnosis not present

## 2023-12-19 DIAGNOSIS — Z0000 Encounter for general adult medical examination without abnormal findings: Secondary | ICD-10-CM | POA: Diagnosis not present

## 2023-12-19 DIAGNOSIS — K5909 Other constipation: Secondary | ICD-10-CM | POA: Diagnosis not present

## 2023-12-19 DIAGNOSIS — E2839 Other primary ovarian failure: Secondary | ICD-10-CM | POA: Diagnosis not present

## 2023-12-19 DIAGNOSIS — R5383 Other fatigue: Secondary | ICD-10-CM | POA: Diagnosis not present

## 2023-12-19 DIAGNOSIS — E039 Hypothyroidism, unspecified: Secondary | ICD-10-CM | POA: Diagnosis not present

## 2023-12-19 DIAGNOSIS — E559 Vitamin D deficiency, unspecified: Secondary | ICD-10-CM | POA: Diagnosis not present

## 2023-12-21 ENCOUNTER — Other Ambulatory Visit (HOSPITAL_BASED_OUTPATIENT_CLINIC_OR_DEPARTMENT_OTHER): Payer: Self-pay | Admitting: Family Medicine

## 2023-12-21 DIAGNOSIS — E2839 Other primary ovarian failure: Secondary | ICD-10-CM
